# Patient Record
Sex: Male | Born: 1995 | Race: White | Hispanic: No | Marital: Single | Smoking: Never smoker
Health system: Southern US, Community
[De-identification: ages and names within clinical notes are randomized; demographics above are authoritative.]

---
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
## 2016-01-05 ENCOUNTER — Encounter: Payer: Self-pay | Admitting: Family Medicine

## 2016-01-05 ENCOUNTER — Ambulatory Visit (INDEPENDENT_AMBULATORY_CARE_PROVIDER_SITE_OTHER): Payer: Managed Care, Other (non HMO) | Admitting: Family Medicine

## 2016-01-05 VITALS — BP 107/59 | HR 72 | Temp 98.4°F | Resp 16

## 2016-01-05 DIAGNOSIS — J020 Streptococcal pharyngitis: Secondary | ICD-10-CM

## 2016-01-05 MED ORDER — AMOXICILLIN 500 MG PO CAPS: 500.0000 mg | ORAL_CAPSULE | Freq: Two times a day (BID) | ORAL | Status: AC

## 2016-01-05 NOTE — Progress Notes (Signed)
Patient ID: Jesse Werner, male   DOB: Jan 27, 1996, 20 y.o.   MRN: [id number]  Patient presents today with complaints of sore throat, fever, minimal cough. Patient states that he has had some postnasal drip related to allergies. He denies taking any allergy medication. He has taken Tylenol. He admits to having strep pharyngitis last year. He denies any abdominal pain, headache, chest pain, shortness of breath, nausea, vomiting.  ROS: Negative except mentioned above.  Vitals as per Epic. GENERAL: NAD HEENT: moderate pharyngeal erythema, no exudate, no erythema of TMs, mild cervical LAD RESP: CTA B CARD: RRR NEURO: CN II-XII grossly intact   A/P: Strep. pharyngitis-rapid strep test was positive, Amoxicillin prescribed, Tylenol/Motrin when necessary, rest, hydration, seek medical attention if symptoms persist or worsen as discussed. Patient should not do any physical activity for at least 2 days and only if afebrile. Discussed disposing of a toothbrush after 2 days.

## 2016-06-29 ENCOUNTER — Other Ambulatory Visit: Payer: Self-pay | Admitting: Family Medicine

## 2016-06-29 ENCOUNTER — Ambulatory Visit
Admission: RE | Admit: 2016-06-29 | Discharge: 2016-06-29 | Disposition: A | Discharge status: Home / Self Care | Payer: PRIVATE HEALTH INSURANCE | Source: Ambulatory Visit | Attending: Family Medicine | Admitting: Family Medicine

## 2016-06-29 DIAGNOSIS — R609 Edema, unspecified: Secondary | ICD-10-CM

## 2016-06-29 DIAGNOSIS — M25461 Effusion, right knee: Secondary | ICD-10-CM | POA: Diagnosis not present

## 2016-06-29 DIAGNOSIS — R52 Pain, unspecified: Secondary | ICD-10-CM

## 2016-06-29 DIAGNOSIS — M25561 Pain in right knee: Secondary | ICD-10-CM | POA: Diagnosis not present

## 2016-06-29 DIAGNOSIS — S8991XA Unspecified injury of right lower leg, initial encounter: Secondary | ICD-10-CM

## 2016-06-30 ENCOUNTER — Ambulatory Visit
Admission: RE | Admit: 2016-06-30 | Discharge: 2016-06-30 | Disposition: A | Discharge status: Home / Self Care | Payer: PRIVATE HEALTH INSURANCE | Source: Ambulatory Visit | Attending: Family Medicine | Admitting: Family Medicine

## 2016-06-30 DIAGNOSIS — S8991XA Unspecified injury of right lower leg, initial encounter: Secondary | ICD-10-CM

## 2016-06-30 DIAGNOSIS — Y9366 Activity, soccer: Secondary | ICD-10-CM | POA: Insufficient documentation

## 2016-06-30 DIAGNOSIS — S8011XA Contusion of right lower leg, initial encounter: Secondary | ICD-10-CM | POA: Diagnosis not present

## 2016-07-11 ENCOUNTER — Ambulatory Visit: Payer: Managed Care, Other (non HMO)

## 2017-06-10 IMAGING — CR DG KNEE COMPLETE 4+V*R*
1 series · 4 of 4 positions shown · non-contrast
Comparison: None.

CLINICAL DATA: Twisted right knee SI beginning on [REDACTED]. Lateral
right knee pain.

EXAM:
RIGHT KNEE - COMPLETE 4+ VIEW

[Series 1: dg knee complete 4 views right · 0.14mm/px · 4 of 4 slices shown]
[im 1/4]
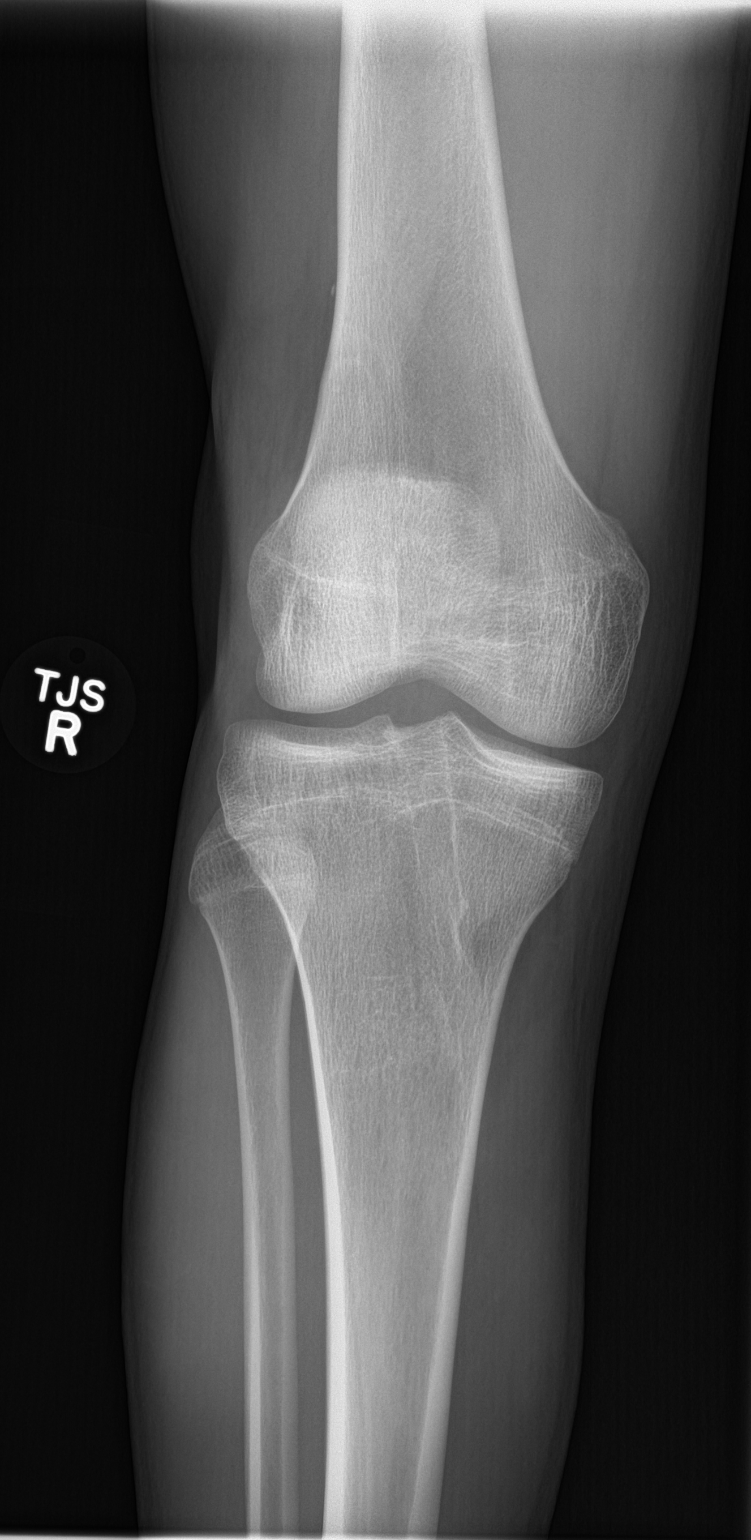
[im 2/4]
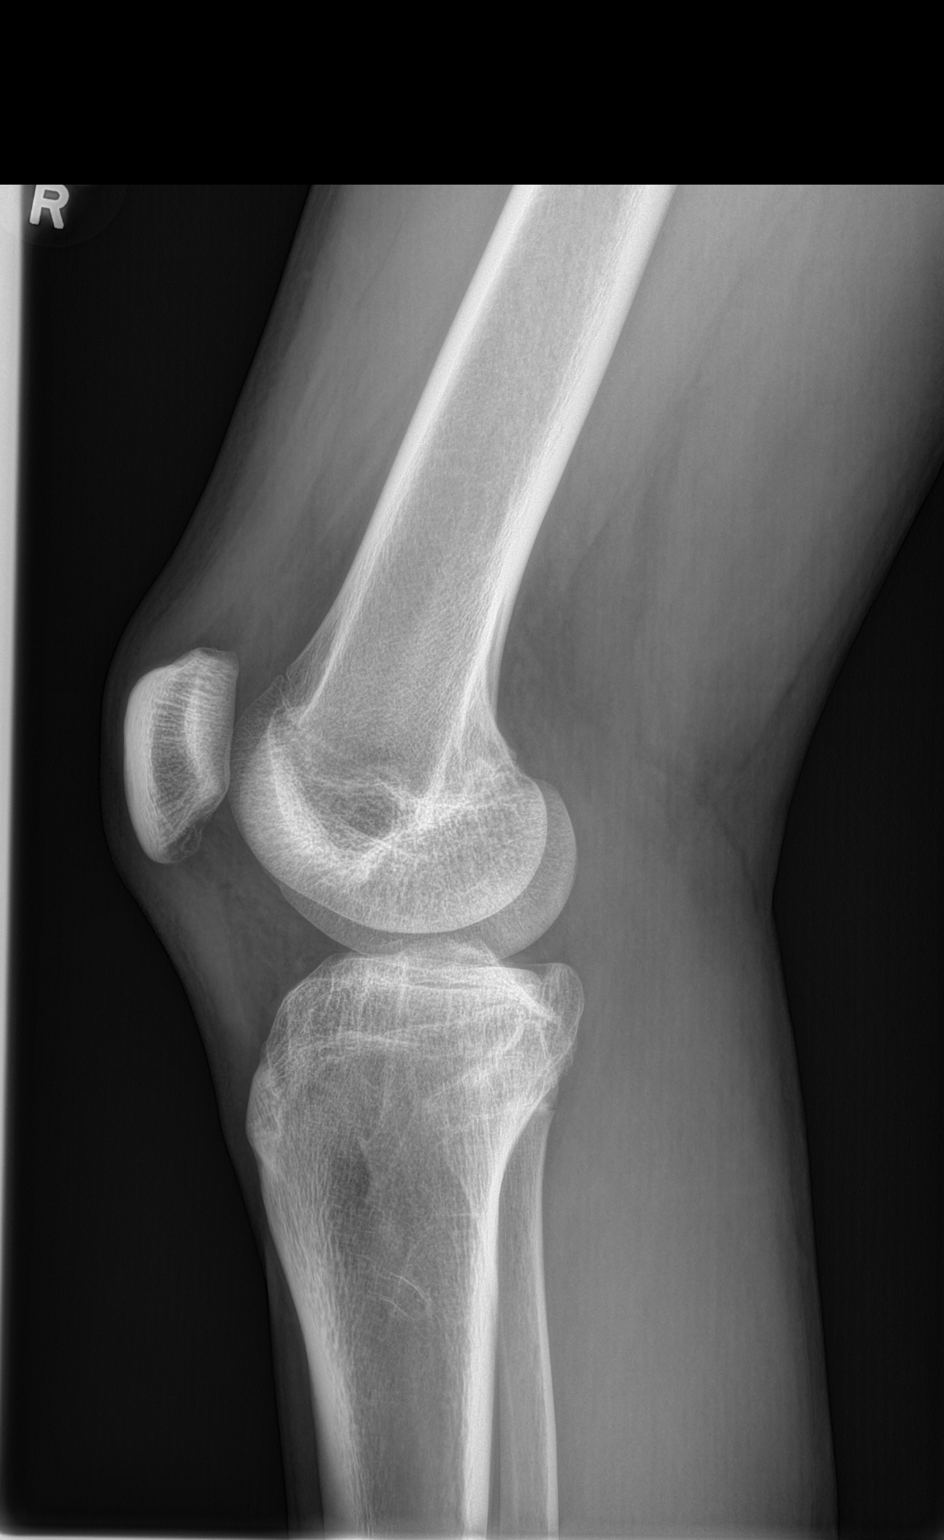
[im 3/4]
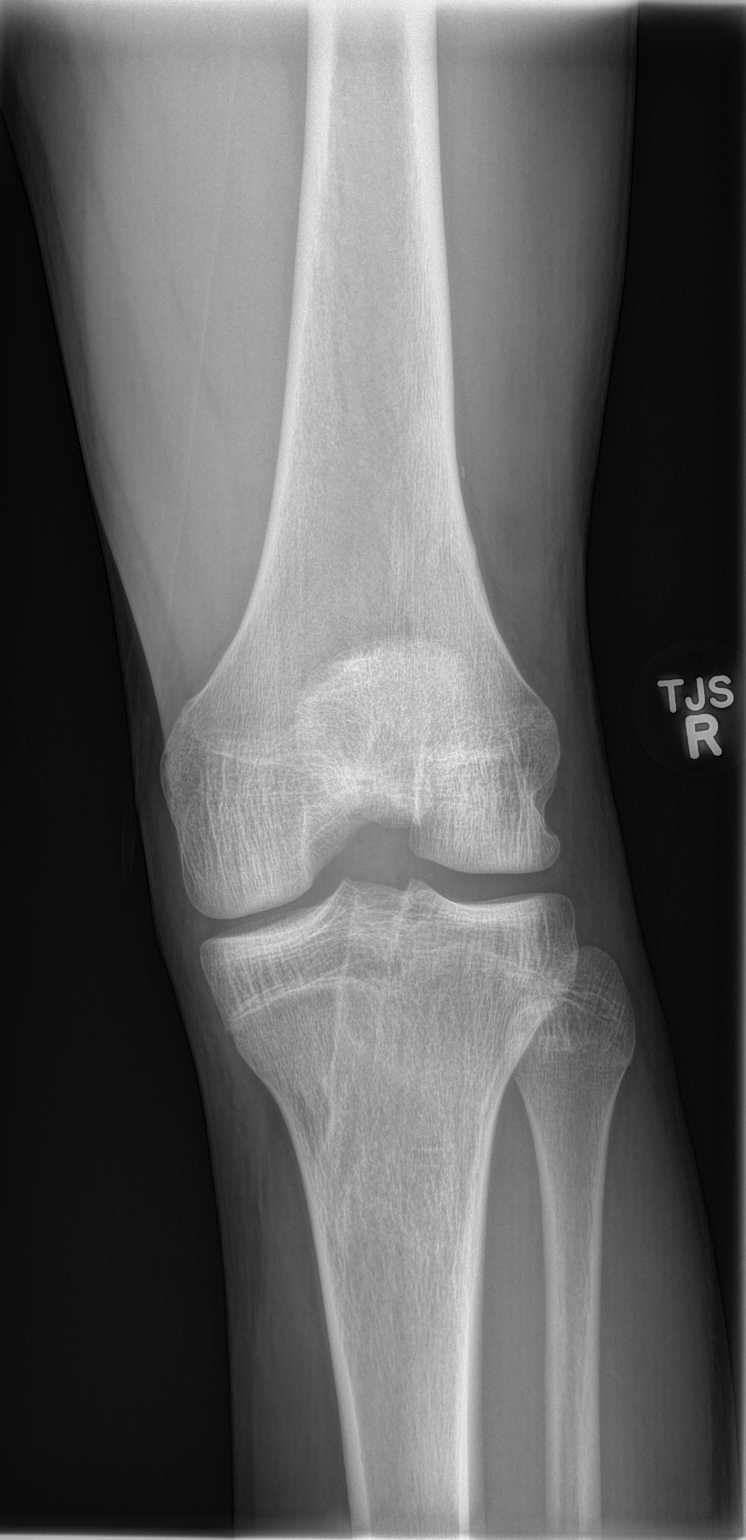
[im 4/4]
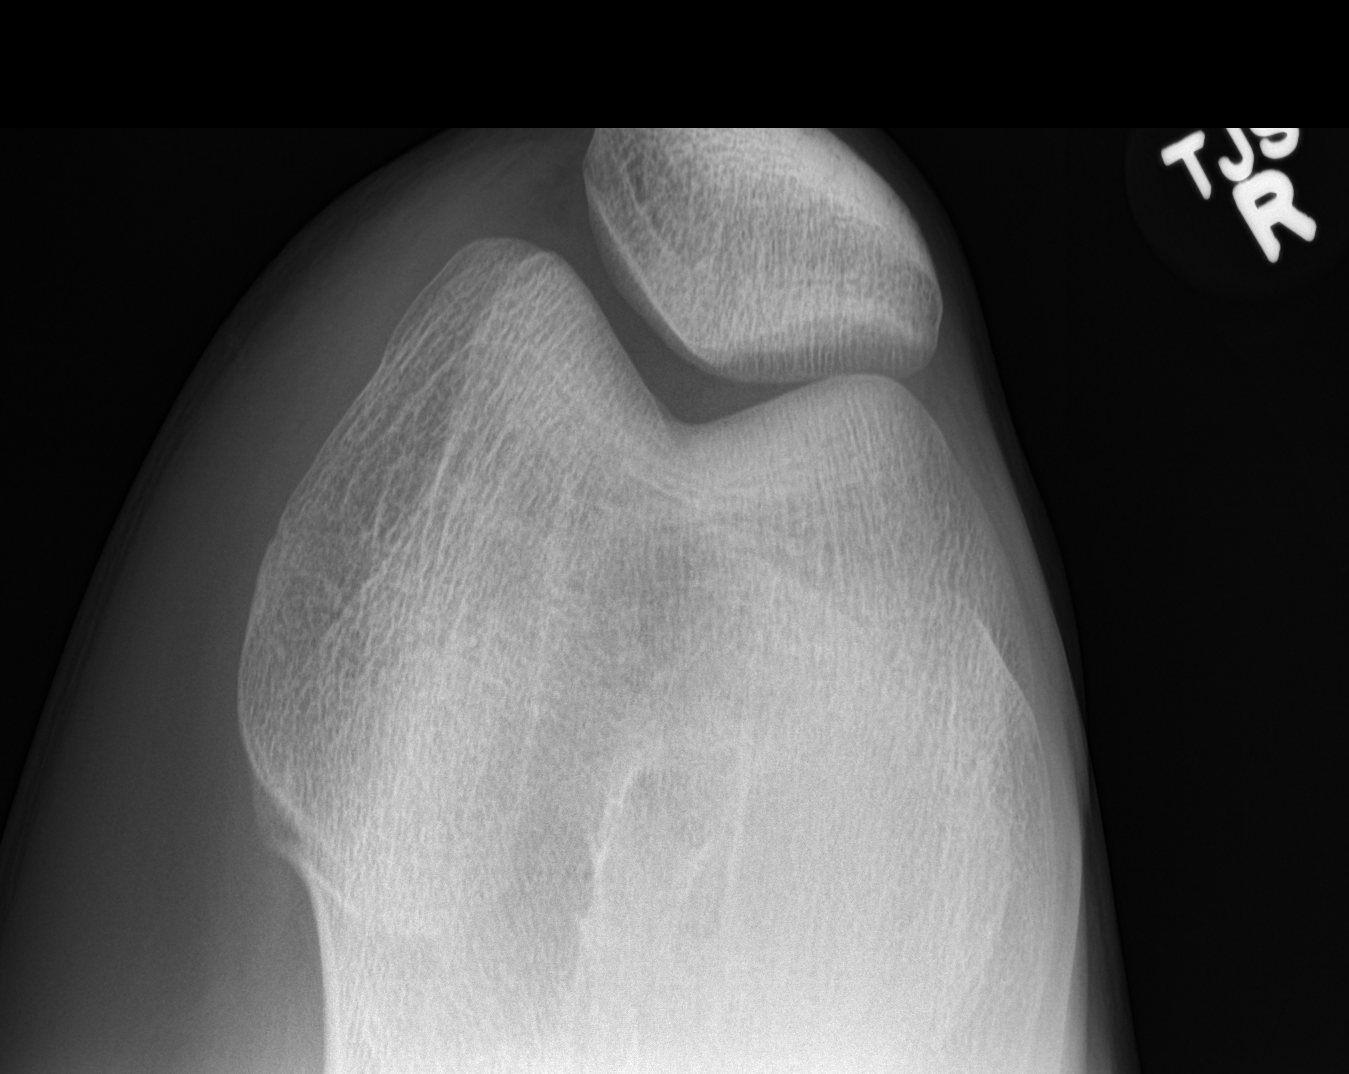

[4 of 4 positions shown; findings below may reference images not displayed]

FINDINGS: Prior ACL repair. No fracture, subluxation or dislocation. Probable
trace joint effusion.
IMPRESSION: No acute bony abnormality.  Trace joint effusion.
